# Patient Record
Sex: Male | Born: 1975 | Race: White | Hispanic: No | Marital: Single | Smoking: Current every day smoker
Health system: Southern US, Community
[De-identification: ages and names within clinical notes are randomized; demographics above are authoritative.]

## PROBLEM LIST (undated history)

## (undated) DIAGNOSIS — K859 Acute pancreatitis without necrosis or infection, unspecified: Secondary | ICD-10-CM

---

## 2006-06-27 ENCOUNTER — Emergency Department (HOSPITAL_COMMUNITY): Admission: EM | Admit: 2006-06-27 | Discharge: 2006-06-28 | Payer: Self-pay | Admitting: Emergency Medicine

## 2006-10-28 ENCOUNTER — Emergency Department (HOSPITAL_COMMUNITY): Admission: EM | Admit: 2006-10-28 | Discharge: 2006-10-28 | Payer: Self-pay | Admitting: Emergency Medicine

## 2006-11-02 ENCOUNTER — Emergency Department (HOSPITAL_COMMUNITY): Admission: EM | Admit: 2006-11-02 | Discharge: 2006-11-02 | Payer: Self-pay | Admitting: Family Medicine

## 2007-06-25 ENCOUNTER — Emergency Department (HOSPITAL_COMMUNITY): Admission: EM | Admit: 2007-06-25 | Discharge: 2007-06-26 | Payer: Self-pay | Admitting: Emergency Medicine

## 2008-05-18 ENCOUNTER — Emergency Department (HOSPITAL_COMMUNITY): Admission: EM | Admit: 2008-05-18 | Discharge: 2008-05-18 | Payer: Self-pay | Admitting: Emergency Medicine

## 2008-06-18 ENCOUNTER — Emergency Department (HOSPITAL_COMMUNITY): Admission: EM | Admit: 2008-06-18 | Discharge: 2008-06-18 | Payer: Self-pay | Admitting: Emergency Medicine

## 2009-08-05 IMAGING — US US ABDOMEN COMPLETE
1 series · 14 of 25 positions shown · non-contrast
Comparison: None.

CLINICAL DATA: Epigastric abdominal pain.

ABDOMINAL ULTRASOUND
TECHNIQUE: Complete abdominal ultrasound examination was performed
including evaluation of the liver, gallbladder, bile ducts,
pancreas, kidneys, spleen, IVC, and abdominal aorta.

[Series 1: unknown · 0.34mm/px · 14 of 54 slices shown]
[im 1/54]
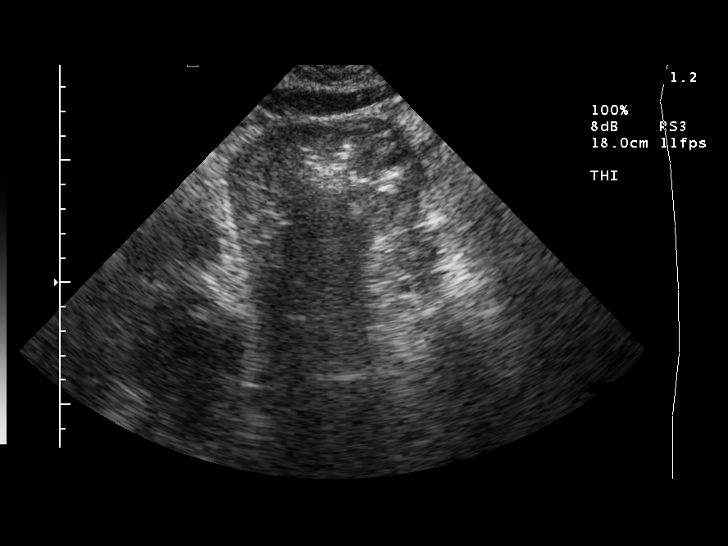
[im 5/54]
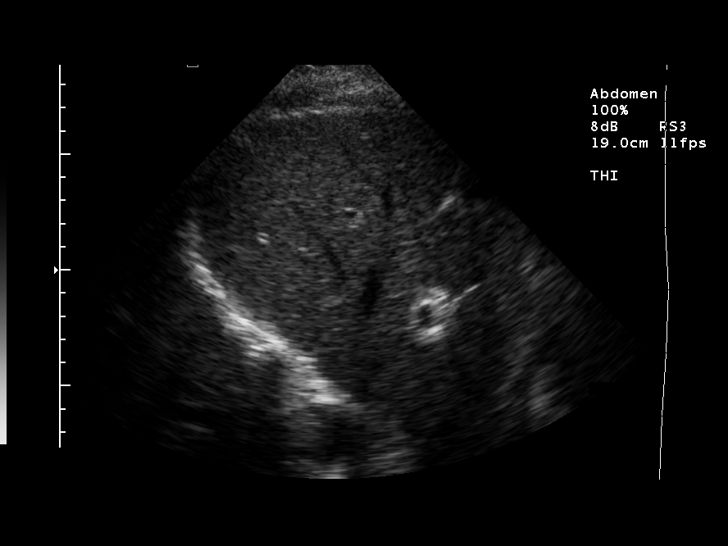
[im 9/54]
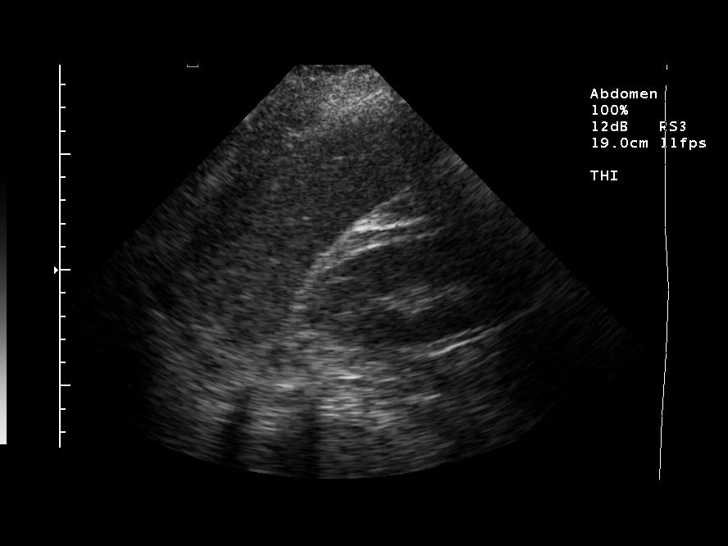
[im 14/54]
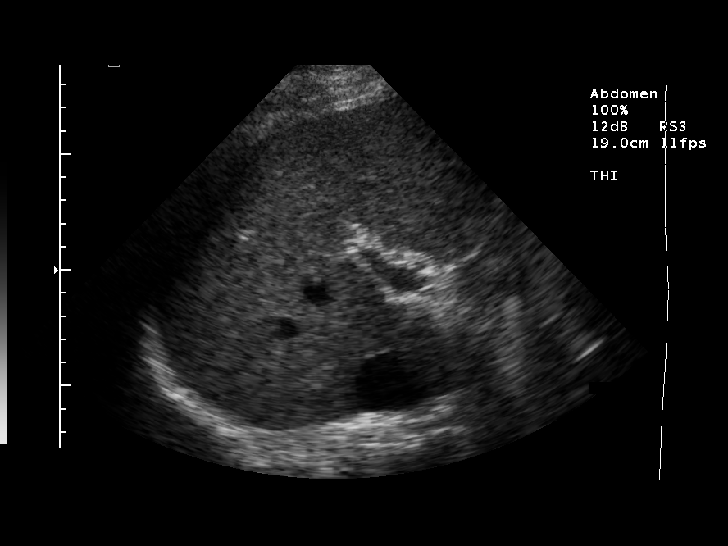
[im 18/54]
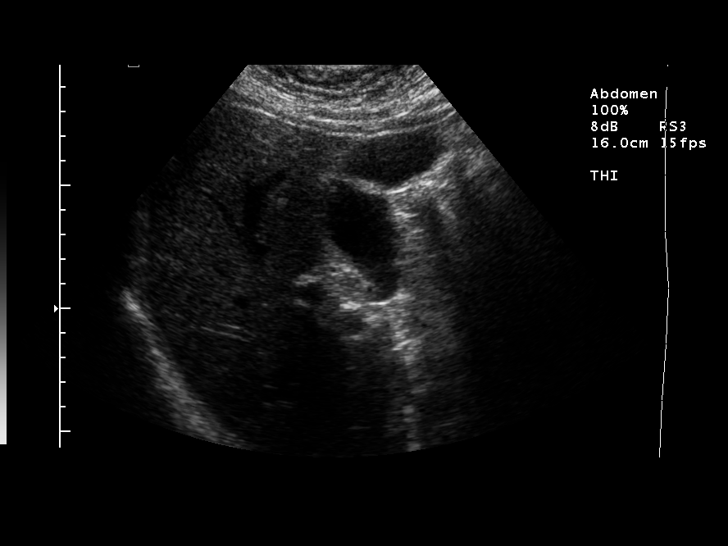
[im 20/54]
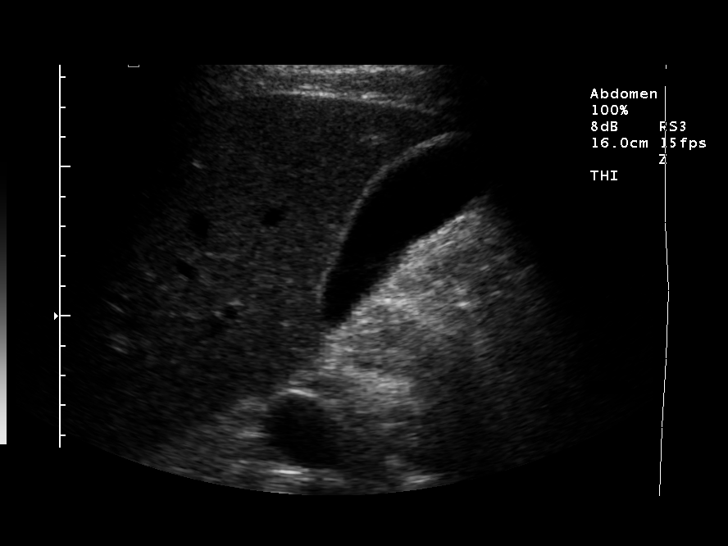
[im 25/54]
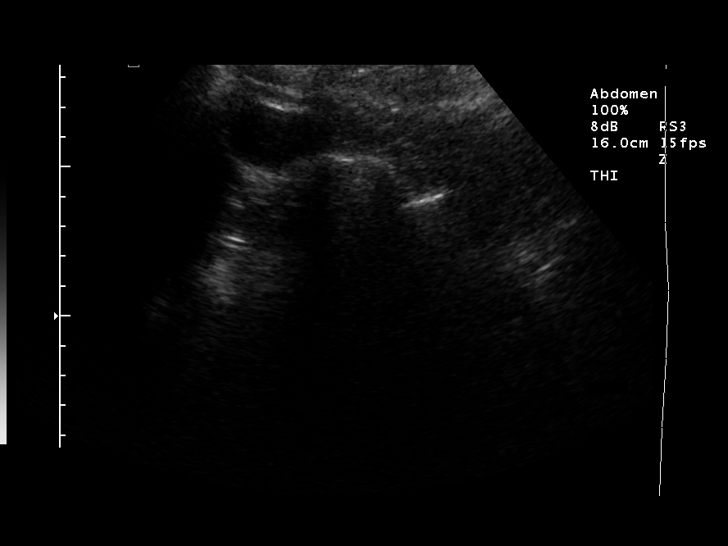
[im 29/54]
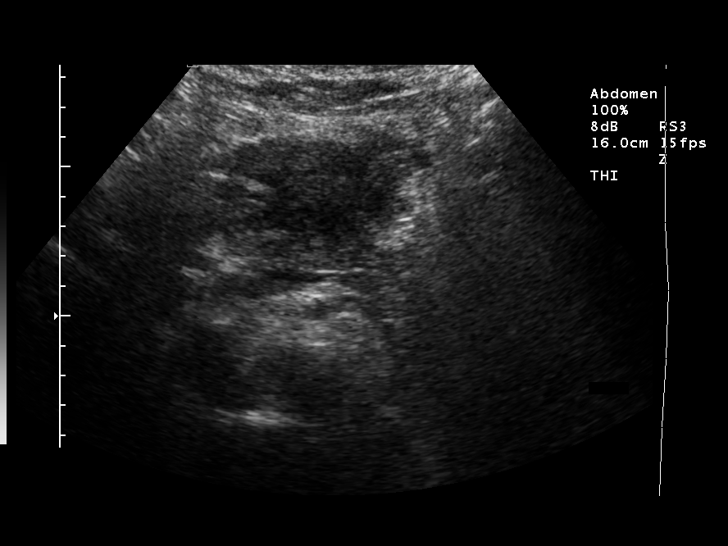
[im 34/54]
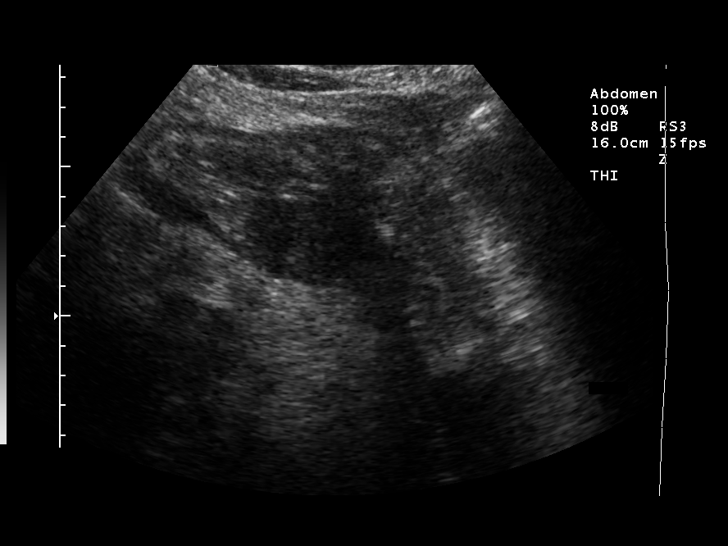
[im 36/54]
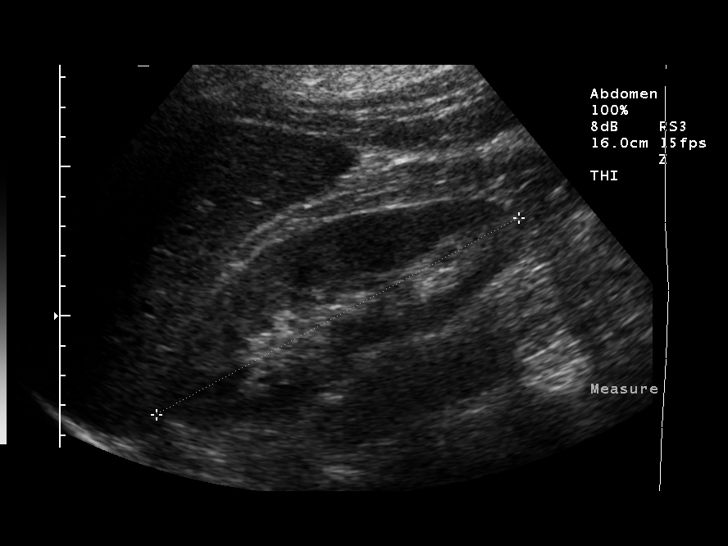
[im 40/54]
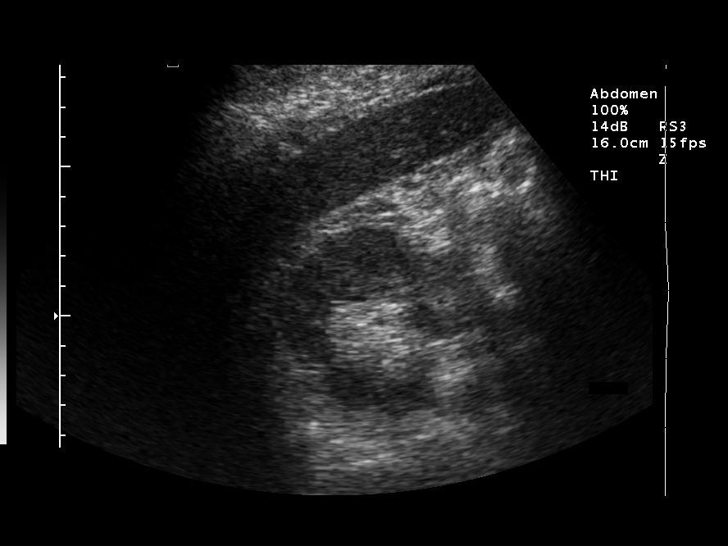
[im 45/54]
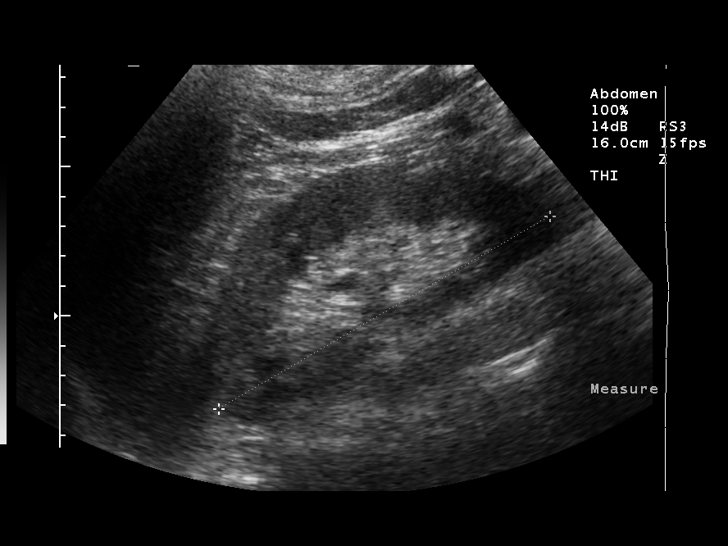
[im 49/54]
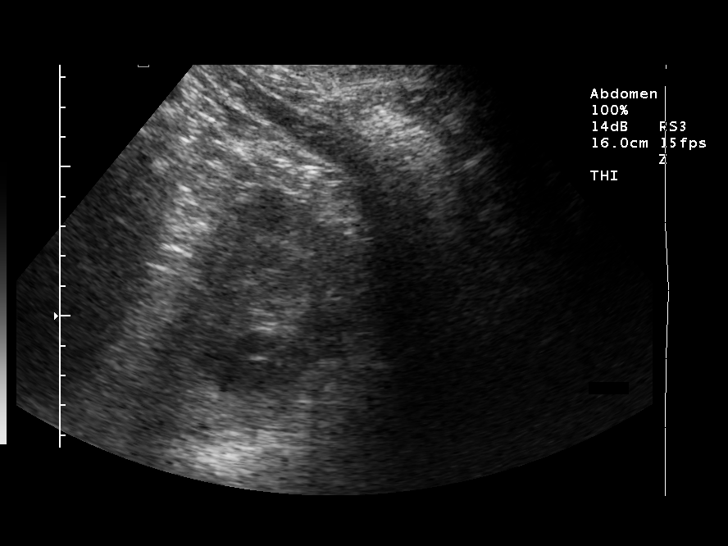
[im 54/54]
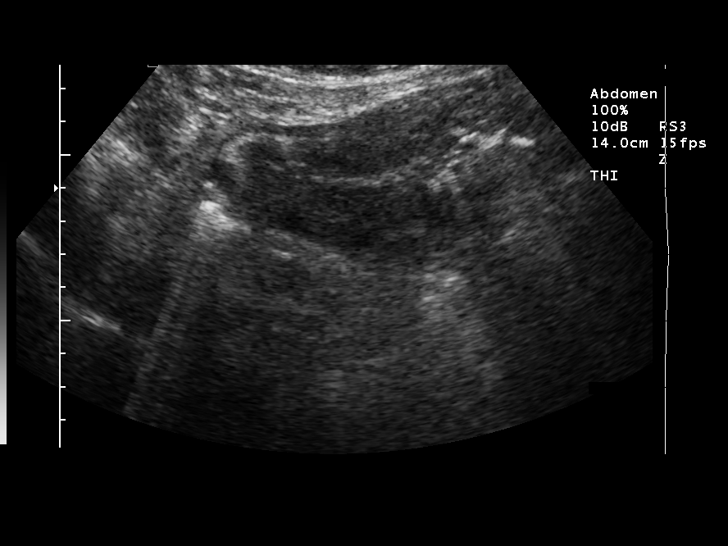

[14 of 25 positions shown; findings below may reference images not displayed]

FINDINGS: Poorly visualized pancreas.  The visualized portions appear normal.
Diffuse antral wall thickening with a maximum thickness of 2.3 cm.
Normal appearing gallblbadder, liver, spleen , kidneys abdominal
aorta and inferior vena cava.  No gallstones, biliary ductal
dilatation or free peritoneal fluid.  The common duct measures
mm in maximum diameter proximally.
IMPRESSION: 1.  Diffuse antral wall thickening.  This could be due to
gastritis, ulcer disease or infiltrative neoplasm such as lymphoma.
Endoscopic correlation is recommended.
2.  Poorly visualized pancreas.
3.  Otherwise, normal examination.

Critical test results discussed with Dr. Vsg at the time of
interpretation on 06/18/2008 at 4244 hours.

## 2011-05-06 LAB — COMPREHENSIVE METABOLIC PANEL
ALT: 28
AST: 22
Albumin: 4.1
CO2: 26
Calcium: 9.5
Chloride: 104
GFR calc Af Amer: >60
Potassium: 4.2
Sodium: 139
Total Bilirubin: 0.4
Total Protein: 7.5

## 2011-05-06 LAB — CBC
Hemoglobin: 16.1
MCV: 88.8
RBC: 5.24
WBC: 15.3 — ABNORMAL HIGH

## 2011-05-06 LAB — DIFFERENTIAL
Basophils Absolute: 0
Lymphocytes Relative: 14
Monocytes Absolute: 1
Monocytes Relative: 7

## 2011-05-06 LAB — POCT CARDIAC MARKERS
Myoglobin, poc: 61.2
Troponin i, poc: <0.05

## 2011-05-07 LAB — CBC
HCT: 43
Hemoglobin: 14.7
MCHC: 34.2
MCV: 89.4
Platelets: 373
RBC: 4.81
RDW: 13.8
WBC: 14.6 — ABNORMAL HIGH

## 2011-05-07 LAB — COMPREHENSIVE METABOLIC PANEL
ALT: 18
Alkaline Phosphatase: 101
BUN: 4 — ABNORMAL LOW
GFR calc Af Amer: >60
GFR calc non Af Amer: >60
Glucose, Bld: 108 — ABNORMAL HIGH
Potassium: 3.9
Total Bilirubin: 0.6
Total Protein: 7.2

## 2011-05-07 LAB — DIFFERENTIAL
Basophils Absolute: 0.1
Basophils Relative: 0
Eosinophils Absolute: 1.3 — ABNORMAL HIGH
Eosinophils Relative: 9 — ABNORMAL HIGH
Lymphocytes Relative: 23
Lymphs Abs: 3.4
Monocytes Absolute: 1.1 — ABNORMAL HIGH
Monocytes Relative: 8
Neutro Abs: 8.8 — ABNORMAL HIGH
Neutrophils Relative %: 60

## 2011-05-07 LAB — COMPREHENSIVE METABOLIC PANEL WITH GFR
AST: 16
Albumin: 3.9
CO2: 25
Calcium: 9.3
Chloride: 104
Creatinine, Ser: 0.91
Sodium: 139

## 2011-05-07 LAB — LIPASE, BLOOD: Lipase: 41

## 2011-05-07 LAB — ETHANOL: Alcohol, Ethyl (B): 6

## 2013-07-26 ENCOUNTER — Encounter (HOSPITAL_COMMUNITY): Payer: Self-pay | Admitting: Emergency Medicine

## 2013-07-26 ENCOUNTER — Emergency Department (HOSPITAL_COMMUNITY)
Admission: EM | Admit: 2013-07-26 | Discharge: 2013-07-27 | Disposition: A | Discharge status: Home / Self Care | Payer: Self-pay | Attending: Emergency Medicine | Admitting: Emergency Medicine

## 2013-07-26 DIAGNOSIS — M545 Low back pain, unspecified: Secondary | ICD-10-CM | POA: Insufficient documentation

## 2013-07-26 DIAGNOSIS — K279 Peptic ulcer, site unspecified, unspecified as acute or chronic, without hemorrhage or perforation: Secondary | ICD-10-CM

## 2013-07-26 DIAGNOSIS — Z8719 Personal history of other diseases of the digestive system: Secondary | ICD-10-CM | POA: Insufficient documentation

## 2013-07-26 DIAGNOSIS — R112 Nausea with vomiting, unspecified: Secondary | ICD-10-CM | POA: Insufficient documentation

## 2013-07-26 DIAGNOSIS — F17200 Nicotine dependence, unspecified, uncomplicated: Secondary | ICD-10-CM | POA: Insufficient documentation

## 2013-07-26 DIAGNOSIS — K921 Melena: Secondary | ICD-10-CM | POA: Insufficient documentation

## 2013-07-26 DIAGNOSIS — K2210 Ulcer of esophagus without bleeding: Secondary | ICD-10-CM | POA: Insufficient documentation

## 2013-07-26 DIAGNOSIS — R109 Unspecified abdominal pain: Secondary | ICD-10-CM | POA: Insufficient documentation

## 2013-07-26 HISTORY — DX: Acute pancreatitis without necrosis or infection, unspecified: K85.90

## 2013-07-26 LAB — COMPREHENSIVE METABOLIC PANEL
ALT: 25 U/L (ref 0–53)
AST: 22 U/L (ref 0–37)
Alkaline Phosphatase: 83 U/L (ref 39–117)
BUN: 15 mg/dL (ref 6–23)
Calcium: 9.7 mg/dL (ref 8.4–10.5)
GFR calc non Af Amer: >90 mL/min (ref >90)
Potassium: 3.5 mEq/L (ref 3.5–5.1)
Total Bilirubin: 0.5 mg/dL (ref 0.3–1.2)
Total Protein: 8.2 g/dL (ref 6.0–8.3)

## 2013-07-26 LAB — URINALYSIS, ROUTINE W REFLEX MICROSCOPIC
Glucose, UA: NEGATIVE mg/dL
Leukocytes, UA: NEGATIVE
Nitrite: NEGATIVE
Protein, ur: NEGATIVE mg/dL
Urobilinogen, UA: 0.2 mg/dL (ref 0.0–1.0)
pH: 6 (ref 5.0–8.0)

## 2013-07-26 LAB — CBC WITH DIFFERENTIAL/PLATELET
Basophils Absolute: 0.1 10*3/uL (ref 0.0–0.1)
MCH: 32.2 pg (ref 26.0–34.0)
RBC: 4.94 MIL/uL (ref 4.22–5.81)
WBC: 12.2 10*3/uL — ABNORMAL HIGH (ref 4.0–10.5)

## 2013-07-26 LAB — URINE MICROSCOPIC-ADD ON

## 2013-07-26 LAB — LIPASE, BLOOD: Lipase: 75 U/L — ABNORMAL HIGH (ref 11–59)

## 2013-07-26 NOTE — ED Notes (Signed)
Pt. reports low abdominal pain / low back pain with nausea and vomitting onset 3 days ago with black stools , pt. was seen at [facility] this evening sent here for further evaluation .

## 2013-07-27 MED ORDER — ONDANSETRON 4 MG PO TBDP: 4.0000 mg | ORAL_TABLET | Freq: Three times a day (TID) | ORAL | Status: AC | PRN

## 2013-07-27 MED ORDER — OMEPRAZOLE 20 MG PO CPDR: 20.0000 mg | DELAYED_RELEASE_CAPSULE | Freq: Two times a day (BID) | ORAL | Status: AC

## 2013-07-27 MED ORDER — SUCRALFATE 1 G PO TABS: 1.0000 g | ORAL_TABLET | Freq: Four times a day (QID) | ORAL | Status: AC

## 2013-07-27 NOTE — ED Provider Notes (Signed)
CSN: [id number]     Arrival date & time 07/26/13  2040 History   First MD Initiated Contact with Patient 07/26/13 2329     Chief Complaint  Patient presents with  . Abdominal Pain    HPI  She presents with a four-day illness. Today is Monday. On Friday started with some nausea and vomiting. Saturday morning had some discomfort in his abdomen.. Glucose umbilical and midline. He vomited some on Saturday. He did not have any blood in his emesis on either day. Pain is described as pressure or burning. It does radiate into his back  somewhat. On Saturday 2 days ago, and Sunday yesterday. Slowly better. Even better today. No vomiting today he went shopping today but decided to come emergency because he has a history of pancreatitis. Diagnosed with pancreatitis 4 years ago. It sounds like this was alcohol related. He does smoke a half pack per day. He drinks 3-4 beers per night. Denies excessive anti-inflammatory use.  He does drink excessive caffeine.  Her stools were black yesterday. Less so today. No blood in stool. No hematemesis.  Past Medical History  Diagnosis Date  . Pancreatitis    History reviewed. No pertinent past surgical history. History reviewed. No pertinent family history. History  Substance Use Topics  . Smoking status: Current Every Day Smoker  . Smokeless tobacco: Not on file  . Alcohol Use: Yes    Review of Systems  Constitutional: Negative for fever, chills, diaphoresis, appetite change and fatigue.  HENT: Negative for mouth sores, sore throat and trouble swallowing.   Eyes: Negative for visual disturbance.  Respiratory: Negative for cough, chest tightness, shortness of breath and wheezing.   Cardiovascular: Negative for chest pain.  Gastrointestinal: Positive for nausea, vomiting and abdominal pain. Negative for diarrhea and abdominal distention.       Black stools  Endocrine: Negative for polydipsia, polyphagia and polyuria.  Genitourinary: Negative for dysuria,  frequency and hematuria.  Musculoskeletal: Negative for gait problem.  Skin: Negative for color change, pallor and rash.  Neurological: Negative for dizziness, syncope, light-headedness and headaches.  Hematological: Does not bruise/bleed easily.  Psychiatric/Behavioral: Negative for behavioral problems and confusion.    Allergies  Review of patient's allergies indicates no known allergies.  Home Medications   Current Outpatient Rx  Name  Route  Sig  Dispense  Refill  . omeprazole (PRILOSEC) 20 MG capsule   Oral   Take 1 capsule (20 mg total) by mouth 2 (two) times daily.   60 capsule   0   . ondansetron (ZOFRAN ODT) 4 MG disintegrating tablet   Oral   Take 1 tablet (4 mg total) by mouth every 8 (eight) hours as needed for nausea.   20 tablet   0   . sucralfate (CARAFATE) 1 G tablet   Oral   Take 1 tablet (1 g total) by mouth 4 (four) times daily.   60 tablet   0    BP 138/87  Pulse 89  Temp(Src) 99.7 F (37.6 C) (Oral)  Resp 14  Ht 5\' 9"  (1.753 m)  Wt 240 lb (108.863 kg)  BMI 35.43 kg/m2  SpO2 100% Physical Exam  Constitutional: He is oriented to person, place, and time. He appears well-developed and well-nourished. No distress.  HENT:  Head: Normocephalic.  Eyes: Conjunctivae are normal. Pupils are equal, round, and reactive to light. No scleral icterus.  His conjunctiva are not pale  Neck: Normal range of motion. Neck supple. No thyromegaly present.  Cardiovascular: Normal  rate and regular rhythm.  Exam reveals no gallop and no friction rub.   No murmur heard. Pulmonary/Chest: Effort normal and breath sounds normal. No respiratory distress. He has no wheezes. He has no rales.  Abdominal: Soft. Bowel sounds are normal. He exhibits no distension. There is no tenderness. There is no rebound.  His abdomen is soft. I cannot reproduce his pain to palpate. Pain-free now.  Musculoskeletal: Normal range of motion.  Neurological: He is alert and oriented to person,  place, and time.  Skin: Skin is warm and dry. No rash noted.  Psychiatric: He has a normal mood and affect. His behavior is normal.    ED Course  Procedures (including critical care time) Labs Review Labs Reviewed  CBC WITH DIFFERENTIAL - Abnormal; Notable for the following:    WBC 12.2 (*)    Lymphs Abs 4.6 (*)    Monocytes Absolute 1.5 (*)    All other components within normal limits  COMPREHENSIVE METABOLIC PANEL - Abnormal; Notable for the following:    Sodium 134 (*)    Chloride 94 (*)    All other components within normal limits  LIPASE, BLOOD - Abnormal; Notable for the following:    Lipase 75 (*)    All other components within normal limits  URINALYSIS, ROUTINE W REFLEX MICROSCOPIC - Abnormal; Notable for the following:    Color, Urine AMBER (*)    Hgb urine dipstick SMALL (*)    Bilirubin Urine SMALL (*)    Ketones, ur 15 (*)    All other components within normal limits  URINE MICROSCOPIC-ADD ON   Imaging Review No results found.  EKG Interpretation   None       MDM   1. Abdominal pain   2. Ulcer disease, with unspecified severity   3. Peptic ulcer disease    Symptoms are consistent with gastritis or ulcer disease. Normal pancreatic enzymes. Normal hepatobiliary enzymes. He is not anemic.  She is appropriate for outpatient GI followup with proton pump inhibitors Carafate and avoiding alcohol tobacco caffeine anti-inflammatories.    Rolland Porter, MD 07/27/13 [phone]

## 2019-03-30 ENCOUNTER — Encounter: Payer: Self-pay | Admitting: Psychiatry

## 2019-03-30 NOTE — Telephone Encounter (Signed)
This encounter was created in error - please disregard.
